# Patient Record
Sex: Female | Born: 1995 | Race: White | Hispanic: No | Marital: Married
Health system: Southern US, Community
[De-identification: ages and names within clinical notes are randomized; demographics above are authoritative.]

## PROBLEM LIST (undated history)

## (undated) DIAGNOSIS — F32A Depression, unspecified: Secondary | ICD-10-CM

## (undated) DIAGNOSIS — F419 Anxiety disorder, unspecified: Secondary | ICD-10-CM

## (undated) HISTORY — PX: OTHER SURGICAL HISTORY: SHX169

## (undated) HISTORY — PX: BREAST SURGERY: SHX581

---

## 2019-10-01 ENCOUNTER — Other Ambulatory Visit: Payer: Self-pay

## 2019-10-01 DIAGNOSIS — Z20822 Contact with and (suspected) exposure to covid-19: Secondary | ICD-10-CM

## 2019-10-03 LAB — NOVEL CORONAVIRUS, NAA: SARS-CoV-2, NAA: NOT DETECTED

## 2020-07-15 ENCOUNTER — Ambulatory Visit
Admission: EM | Admit: 2020-07-15 | Discharge: 2020-07-15 | Disposition: A | Discharge status: Home / Self Care | Payer: BC Managed Care – PPO

## 2020-07-15 ENCOUNTER — Other Ambulatory Visit: Payer: Self-pay

## 2020-07-15 DIAGNOSIS — Z111 Encounter for screening for respiratory tuberculosis: Secondary | ICD-10-CM | POA: Diagnosis not present

## 2020-07-18 ENCOUNTER — Other Ambulatory Visit: Payer: Self-pay

## 2020-07-18 ENCOUNTER — Ambulatory Visit
Admission: EM | Admit: 2020-07-18 | Discharge: 2020-07-18 | Disposition: A | Discharge status: Home / Self Care | Payer: BC Managed Care – PPO | Attending: Emergency Medicine | Admitting: Emergency Medicine

## 2020-07-18 DIAGNOSIS — Z111 Encounter for screening for respiratory tuberculosis: Secondary | ICD-10-CM

## 2020-07-18 NOTE — ED Triage Notes (Signed)
PPD test result on right forearm was 52mm Negative at 0909 on 07/18/20.

## 2021-11-10 ENCOUNTER — Ambulatory Visit: Payer: BC Managed Care – PPO | Admitting: Urology

## 2021-11-10 NOTE — Progress Notes (Signed)
11/11/21 12:25 PM   [name] [birth year] [id number]  Referring provider:  Haroldine Laws, CNM [address] [place],  [place] [address] Chief Complaint  Patient presents with   Dysuria     HPI: Haley Barnett is a 25 y.o.female who presents today for further evaluation of dysuria.   She reports that she had a UTI in October and was seen at urgent care in [facility]. She brought her labs from this visit.  There are standard blood work and a urine culture growing 10-25 colonies of Enterococcus.  There is no associated urinalysis.  She reports that she was prescribed Macrobid first by urgent care, followed by Cipro when her symptoms quickly recurred and then again by her CNM. She has been experiencing burning during urination that is in the opening of the urethra. She feels that her urinary tract is "bloated/inflamed". She reports that her last UTI was when she was in high school.   She was seen by her PCP, Haroldine Laws, CNM, for urinary urgency, vaginal itching, odor, and dysuria on 10/22/2021. She was given Macrobid on 09/24/2021 and cipro on 10/01/2021 for five day course. Urinalysis was negative for infection. STI screening was negative. Pelvis exam was also unremarkable. She was referred to urology.   She has been having issues with constipation and she saw gastroenterology for this.   She is married and sexually active.  She recently stopped oral contraceptive pills in September for more "natural" approach.  She did started her menstrual cycle.  She has a personal history of anxiety and depression.   Her urinalysis today was negative for infection.    PMH: Anxiety depression  Surgical History: NA  Home Medications:  Allergies as of 11/11/2021       Reactions   Amoxicillin Hives, Rash        Medication List        Accurate as of November 11, 2021 12:25 PM. If you have any questions, ask your nurse or doctor.          Cholecalciferol 25 MCG (1000  UT) tablet Take by mouth.   Uribel 118 MG Caps Take 1 capsule (118 mg total) by mouth 4 (four) times daily as needed. Started by: Vanna Scotland, MD   vitamin B-12 1000 MCG tablet Commonly known as: CYANOCOBALAMIN Take 1,000 mcg by mouth daily.        Allergies:  Allergies  Allergen Reactions   Amoxicillin Hives and Rash    Family History: No family history on file.  Social History:  reports that she has never smoked. She has never used smokeless tobacco. She reports that she does not currently use alcohol. No history on file for drug use.   Physical Exam: BP 130/84   Pulse 88   Ht 5\' 10"  (1.778 m)   Wt 145 lb (65.8 kg)   BMI 20.81 kg/m   Constitutional:  Alert and oriented, No acute distress. HEENT: [place] AT, moist mucus membranes.  Trachea midline, no masses. Cardiovascular: No clubbing, cyanosis, or edema. Respiratory: Normal respiratory effort, no increased work of breathing. Skin: No rashes, bruises or suspicious lesions. Neurologic: Grossly intact, no focal deficits, moving all 4 extremities. Psychiatric: Normal mood and affect.  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 -Urinalysis today is unremarkable -Unclear whether or not this is residual inflammation/irritation from UTI  diagnosed back in October which grew low colony count of Enterococcus versus some underlying inflammatory bladder condition such as IC.  Based on her age and lack of comorbidities, do not suspect any additional underlying pathology. -Patient does have a personal history of anxiety, constipation, and recently stopped birth control all of which may be triggering her urinary tract symptoms - Counseled her in UTI prevention supplements such as cranberry tablets, probiotics, yogurt that has active  lactobacillus culture, and d-mannose  (hold cranberry if worsens symptoms) -We discussed the diease pathophysiology of IC which is poorly understood therefore treatment has been focused primarily on symptomatic relief as well as dietary and behavioral modification. Information pamphlets were reviewed and given today discussing the current understanding of the syndrome as well as treatment options. IC dietary information also given today as many patients experience relief with simple lifestyle modifications. We also discussed intermittent use of Uribel as needed  If conservative management fails, will consider further work up with cystoscopy to access for Hunter's ulcers or other more aggressive treatments, however, response to each of these interventions is highly variable.   - Prescription for Uribel to be used prn  - Urine sent for Mycoplasma/ureaplasms culture  -Follow-up if symptoms fail to improve or do not resolve   Tawni Millers as a scribe for Vanna Scotland, MD.,have documented all relevant documentation on the behalf of Vanna Scotland, MD,as directed by  Vanna Scotland, MD while in the presence of Vanna Scotland, MD.   [facility] [address], [address] [place], [place] [address] [phone]  I spent 45 total minutes on the day of the encounter including pre-visit review of the medical record, face-to-face time with the patient, and post visit ordering of labs/imaging/tests.

## 2021-11-11 ENCOUNTER — Encounter: Payer: Self-pay | Admitting: Urology

## 2021-11-11 ENCOUNTER — Ambulatory Visit: Payer: BC Managed Care – PPO | Admitting: Urology

## 2021-11-11 ENCOUNTER — Other Ambulatory Visit: Payer: Self-pay

## 2021-11-11 VITALS — BP 130/84 | HR 88 | Ht 70.0 in | Wt 145.0 lb

## 2021-11-11 DIAGNOSIS — R300 Dysuria: Secondary | ICD-10-CM | POA: Diagnosis not present

## 2021-11-11 LAB — URINALYSIS, COMPLETE
Bilirubin, UA: NEGATIVE
Glucose, UA: NEGATIVE
Ketones, UA: NEGATIVE
Leukocytes,UA: NEGATIVE
Nitrite, UA: NEGATIVE
Protein,UA: NEGATIVE
Specific Gravity, UA: <1.005 — ABNORMAL LOW (ref 1.005–1.030)
Urobilinogen, Ur: 0.2 mg/dL (ref 0.2–1.0)
pH, UA: 7 (ref 5.0–7.5)

## 2021-11-11 LAB — MICROSCOPIC EXAMINATION
Bacteria, UA: NONE SEEN
RBC, Urine: NONE SEEN /hpf (ref 0–2)
WBC, UA: NONE SEEN /hpf (ref 0–5)

## 2021-11-11 MED ORDER — URIBEL 118 MG PO CAPS: 1.0000 | ORAL_CAPSULE | Freq: Four times a day (QID) | ORAL | 0 refills | Status: AC | PRN

## 2021-11-11 NOTE — Patient Instructions (Addendum)
Cranberry Tablets as directed. Probiotics as directed. D-Mannose as directed.     IC/BPS Flare Fact Sheet  Interstitial cystitis patients often struggle with "flares," a sudden and dramatic worsening of their bladder symptoms. Lasting from hours to weeks, flares can be unpredictable, disruptive and difficult to manage for newly diagnosed and veteran IC patients. Bladder wall irritation is the most common type of flare, often occurring after patients eat acidic foods, are under high stress or struggling with hormone changes. Pelvic floor muscle tension can also drive flares, particularly when those muscles are provoked through riding in a car or sexual intimacy. The good news is that flares are generally short term. They do, however, require prompt intervention to reduce/avoid the trigger and minimize the duration of the flare.  Typical Flare Triggers  DIET - 95% of patients report that certain foods exacerbate their IC symptoms. The most common offenders are foods high in acid, caffeine and alcohol.  DRIVING - 14% of IC patients report pain and discomfort while riding in a car, train, airplane or on a motorcycle. Patients are encouraged to avoid long rides until their condition has improved.  STRESS & ANXIETY - High periods of physical or emotional stress can exacerbate symptoms, including periods of intense cold, heat and emotional distress.  SEX & INTIMACY - Men with IC may experience pain at the moment of orgasm while women often feel their worst 24-48 hours after intercourse, the result of pelvic floor muscle spasms.  EXERCISE - Exercise that jars or puts pressure on the pelvic floor (i.e. riding a bicycle or motorcycle, spinning, running or stairs) can exacerbate pelvic pain and bladder symptoms. Exercises that keep the hips level are ideal, such as: walking, elliptical, rowing, gentle yoga and/or pilates.  HORMONES - Many women struggle with short term flares during  ovulation and a few days before their period. Ironically, some patients flare when their progesterone levels are higher, while others flare when their estrogen levels are higher. Post-menopausal women may experience more bladder, urethral and vulvar sensitivity due to dryness of the skin. Using a compounded, preservative-free estrogen cream may help patients with estrogen atrophy.  CHEMICAL EXPOSURE - Chemical exposures can trigger an IC flare, including: scented candles, room fresheners, cleansers, paints, solvents and pest control products. Patients with sensitive skin report that scented laundry detergent, fabric softeners and/or dryer sheets can trigger urethral, vulvar or rectal discomfort.  The [facility] offers more detailed information on flares and hour by hour rescue plans that can help reduce discomfort.

## 2021-11-17 ENCOUNTER — Encounter: Payer: Self-pay | Admitting: Urology

## 2021-11-17 LAB — MYCOPLASMA / UREAPLASMA CULTURE
Mycoplasma hominis Culture: NEGATIVE
Ureaplasma urealyticum: NEGATIVE

## 2022-02-10 ENCOUNTER — Other Ambulatory Visit: Payer: Self-pay | Admitting: Gastroenterology

## 2022-02-10 DIAGNOSIS — R1900 Intra-abdominal and pelvic swelling, mass and lump, unspecified site: Secondary | ICD-10-CM

## 2022-02-28 ENCOUNTER — Ambulatory Visit
Admission: RE | Admit: 2022-02-28 | Discharge: 2022-02-28 | Disposition: A | Discharge status: Home / Self Care | Payer: BC Managed Care – PPO | Source: Ambulatory Visit | Attending: Gastroenterology | Admitting: Gastroenterology

## 2022-02-28 ENCOUNTER — Other Ambulatory Visit: Payer: Self-pay

## 2022-02-28 DIAGNOSIS — R1900 Intra-abdominal and pelvic swelling, mass and lump, unspecified site: Secondary | ICD-10-CM | POA: Insufficient documentation

## 2022-03-01 ENCOUNTER — Encounter: Payer: Self-pay | Admitting: General Surgery

## 2022-03-02 ENCOUNTER — Ambulatory Visit
Admission: RE | Admit: 2022-03-02 | Discharge: 2022-03-02 | Disposition: A | Discharge status: Home / Self Care | Payer: BC Managed Care – PPO | Attending: General Surgery | Admitting: General Surgery

## 2022-03-02 ENCOUNTER — Ambulatory Visit: Payer: BC Managed Care – PPO | Admitting: Registered Nurse

## 2022-03-02 ENCOUNTER — Encounter: Payer: Self-pay | Admitting: General Surgery

## 2022-03-02 ENCOUNTER — Encounter
Admission: RE | Disposition: A | Discharge status: Home / Self Care | Payer: Self-pay | Source: Home / Self Care | Attending: General Surgery

## 2022-03-02 DIAGNOSIS — K5900 Constipation, unspecified: Secondary | ICD-10-CM | POA: Diagnosis not present

## 2022-03-02 HISTORY — DX: Anxiety disorder, unspecified: F41.9

## 2022-03-02 HISTORY — DX: Depression, unspecified: F32.A

## 2022-03-02 HISTORY — PX: COLONOSCOPY WITH PROPOFOL: SHX5780

## 2022-03-02 LAB — POCT PREGNANCY, URINE
Preg Test, Ur: NEGATIVE
Preg Test, Ur: NEGATIVE

## 2022-03-02 SURGERY — COLONOSCOPY WITH PROPOFOL
Anesthesia: General

## 2022-03-02 MED ORDER — SODIUM CHLORIDE 0.9 % IV SOLN: INTRAVENOUS | Status: DC

## 2022-03-02 MED ORDER — EPHEDRINE SULFATE (PRESSORS) 50 MG/ML IJ SOLN
INTRAMUSCULAR | Status: DC | PRN
  Administered 2022-03-02: 5 mg via INTRAVENOUS

## 2022-03-02 MED ORDER — PROPOFOL 500 MG/50ML IV EMUL
INTRAVENOUS | Status: DC | PRN
  Administered 2022-03-02: 200 ug/kg/min via INTRAVENOUS

## 2022-03-02 MED ORDER — PROPOFOL 10 MG/ML IV BOLUS
INTRAVENOUS | Status: DC | PRN
Start: 2022-03-02 — End: 2022-03-02
  Administered 2022-03-02: 10 mg via INTRAVENOUS
  Administered 2022-03-02: 70 mg via INTRAVENOUS
  Administered 2022-03-02 (×2): 10 mg via INTRAVENOUS
  Administered 2022-03-02: 50 mg via INTRAVENOUS
  Administered 2022-03-02: 10 mg via INTRAVENOUS
  Administered 2022-03-02 (×2): 30 mg via INTRAVENOUS
  Administered 2022-03-02: 10 mg via INTRAVENOUS

## 2022-03-02 MED ORDER — DEXMEDETOMIDINE HCL 200 MCG/2ML IV SOLN
INTRAVENOUS | Status: DC | PRN
  Administered 2022-03-02: 20 ug via INTRAVENOUS

## 2022-03-02 MED ORDER — LIDOCAINE HCL (CARDIAC) PF 100 MG/5ML IV SOSY
PREFILLED_SYRINGE | INTRAVENOUS | Status: DC | PRN
  Administered 2022-03-02: 100 mg via INTRAVENOUS

## 2022-03-02 MED ORDER — PHENYLEPHRINE HCL (PRESSORS) 10 MG/ML IV SOLN
INTRAVENOUS | Status: DC | PRN
  Administered 2022-03-02: 120 ug via INTRAVENOUS
  Administered 2022-03-02: 80 ug via INTRAVENOUS

## 2022-03-02 NOTE — Transfer of Care (Signed)
Immediate Anesthesia Transfer of Care Note ? ?Patient: Haley Barnett ? ?Procedure(s) Performed: COLONOSCOPY WITH PROPOFOL ? ?Patient Location: Endoscopy Unit ? ?Anesthesia Type:General ? ?Level of Consciousness: drowsy ? ?Airway & Oxygen Therapy: Patient Spontanous Breathing ? ?Post-op Assessment: Report given to RN and Post -op Vital signs reviewed and stable ? ?Post vital signs: Reviewed and stable ? ?Last Vitals:  ?Vitals Value Taken Time  ?BP 92/39 03/02/22 0820  ?Temp    ?Pulse 67 03/02/22 0820  ?Resp 19 03/02/22 0820  ?SpO2 99 % 03/02/22 0820  ?Vitals shown include unvalidated device data. ? ?Last Pain:  ?Vitals:  ? 03/02/22 0731  ?TempSrc: Tympanic  ?PainSc: 0-No pain  ?   ? ?  ? ?Complications: No notable events documented. ?

## 2022-03-02 NOTE — Op Note (Addendum)
[facility] ?Gastroenterology ?Patient Name: Haley Haley ?Procedure Date: 03/02/2022 7:49 AM ?MRN: [id number] ?Account #: [id number] ?Date of Birth: 05/27/1996 ?Admit Type: Outpatient ?Age: 26 ?Room: [facility] ENDO ROOM 1 ?Gender: Female ?Note Status: Supervisor Override ?Instrument Name: Peds Colonoscope [id number] ?Procedure:             Colonoscopy ?Indications:           Abnormal rectal exam, Change in bowel habits ?Providers:             Earline Mayotte, MD ?Referring MD:          Haley Applebaum, MD (Referring MD) ?Medicines:             Propofol per Anesthesia ?Complications:         No immediate complications. ?Procedure:             Pre-Anesthesia Assessment: ?                       - Prior to the procedure, a History and Physical was  ?                       performed, and patient medications, allergies and  ?                       sensitivities were reviewed. The patient's tolerance  ?                       of previous anesthesia was reviewed. ?                       - The risks and benefits of the procedure and the  ?                       sedation options and risks were discussed with the  ?                       patient. All questions were answered and informed  ?                       consent was obtained. ?                       After obtaining informed consent, the colonoscope was  ?                       passed under direct vision. Throughout the procedure,  ?                       the patient's blood pressure, pulse, and oxygen  ?                       saturations were monitored continuously. The  ?                       Colonoscope was introduced through the anus and  ?                       advanced to the the cecum, identified by appendiceal  ?  orifice and ileocecal valve. The patient tolerated the  ?                       procedure well. The quality of the bowel preparation  ?                       was excellent. The colonoscopy was somewhat difficult  ?                        due to significant looping. Successful completion of  ?                       the procedure was aided by using manual pressure. ?Findings: ?     The entire examined colon appeared normal on direct and retroflexion  ?     views. ?     The digital rectal exam showed a retro-flexed uterus without any  ?     adjacent mucosal abnormality./ ?Impression:            - The entire examined colon is normal on direct and  ?                       retroflexion views. ?                       - No specimens collected. ?Recommendation:        - Discharge patient to home (via wheelchair). ?                       - Resume regular diet. ?Procedure Code(s):     --- Professional --- ?                       [phone], Colonoscopy, flexible; diagnostic, including  ?                       collection of specimen(s) by brushing or washing, when  ?                       performed (separate procedure) ?CPT copyright 2019 American Medical Association. All rights reserved. ?The codes documented in this report are preliminary and upon coder review may  ?be revised to meet current compliance requirements. ?Earline Mayotte, MD ?03/02/2022 8:19:49 AM ?This report has been signed electronically. ?Number of Addenda: 0 ?Note Initiated On: 03/02/2022 7:49 AM ?Scope Withdrawal Time: 0 hours 6 minutes 48 seconds  ?Total Procedure Duration: 0 hours 15 minutes 5 seconds  ?Estimated Blood Loss:  Estimated blood loss: none. ?     [facility] ?

## 2022-03-02 NOTE — Anesthesia Preprocedure Evaluation (Signed)
Anesthesia Evaluation  ?Patient identified by MRN, date of birth, ID band ?Patient awake ? ? ? ?Reviewed: ?Allergy & Precautions, NPO status , Patient's Chart, lab work & pertinent test results ? ?History of Anesthesia Complications ?Negative for: history of anesthetic complications ? ?Airway ?Mallampati: III ? ?TM Distance: >3 FB ?Neck ROM: full ? ? ? Dental ? ?(+) Chipped ?  ?Pulmonary ?neg pulmonary ROS, neg shortness of breath,  ?  ?Pulmonary exam normal ? ? ? ? ? ? ? Cardiovascular ?Exercise Tolerance: Good ?(-) Past MI negative cardio ROS ?Normal cardiovascular exam ? ? ?  ?Neuro/Psych ?PSYCHIATRIC DISORDERS negative neurological ROS ?   ? GI/Hepatic ?negative GI ROS, Neg liver ROS, neg GERD  ,  ?Endo/Other  ?negative endocrine ROS ? Renal/GU ?negative Renal ROS  ?negative genitourinary ?  ?Musculoskeletal ? ? Abdominal ?  ?Peds ? Hematology ?negative hematology ROS ?(+)   ?Anesthesia Other Findings ?Past Medical History: ?No date: Anxiety ?No date: Depression ? ?Past Surgical History: ?No date: BREAST SURGERY ?No date: wisdom teeth rmoval ? ?BMI   ? Body Mass Index: 20.81 kg/m?  ?  ? ? Reproductive/Obstetrics ?negative OB ROS ? ?  ? ? ? ? ? ? ? ? ? ? ? ? ? ?  ?  ? ? ? ? ? ? ? ? ?Anesthesia Physical ?Anesthesia Plan ? ?ASA: 2 ? ?Anesthesia Plan: General  ? ?Post-op Pain Management:   ? ?Induction: Intravenous ? ?PONV Risk Score and Plan: Propofol infusion and TIVA ? ?Airway Management Planned: Natural Airway and Nasal Cannula ? ?Additional Equipment:  ? ?Intra-op Plan:  ? ?Post-operative Plan:  ? ?Informed Consent: I have reviewed the patients History and Physical, chart, labs and discussed the procedure including the risks, benefits and alternatives for the proposed anesthesia with the patient or authorized representative who has indicated his/her understanding and acceptance.  ? ? ? ?Dental Advisory Given ? ?Plan Discussed with: Anesthesiologist, CRNA and  Surgeon ? ?Anesthesia Plan Comments: (Patient consented for risks of anesthesia including but not limited to:  ?- adverse reactions to medications ?- risk of airway placement if required ?- damage to eyes, teeth, lips or other oral mucosa ?- nerve damage due to positioning  ?- sore throat or hoarseness ?- Damage to heart, brain, nerves, lungs, other parts of body or loss of life ? ?Patient voiced understanding.)  ? ? ? ? ? ? ?Anesthesia Quick Evaluation ? ?

## 2022-03-02 NOTE — Anesthesia Postprocedure Evaluation (Signed)
Anesthesia Post Note ? ?Patient: Haley Barnett ? ?Procedure(s) Performed: COLONOSCOPY WITH PROPOFOL ? ?Patient location during evaluation: Endoscopy ?Anesthesia Type: General ?Level of consciousness: awake and alert ?Pain management: pain level controlled ?Vital Signs Assessment: post-procedure vital signs reviewed and stable ?Respiratory status: spontaneous breathing, nonlabored ventilation, respiratory function stable and patient connected to nasal cannula oxygen ?Cardiovascular status: blood pressure returned to baseline and stable ?Postop Assessment: no apparent nausea or vomiting ?Anesthetic complications: no ? ? ?No notable events documented. ? ? ?Last Vitals:  ?Vitals:  ? 03/02/22 0840 03/02/22 0850  ?BP: 94/60 (!) 103/55  ?Pulse: 84 86  ?Resp: 20 16  ?Temp:    ?SpO2: 100% 100%  ?  ?Last Pain:  ?Vitals:  ? 03/02/22 0731  ?TempSrc: Tympanic  ?PainSc: 0-No pain  ? ? ?  ?  ?  ?  ?  ?  ? ?Cleda Mccreedy Lariza Cothron ? ? ? ? ?

## 2022-03-02 NOTE — H&P (Signed)
Haley Barnett ?[id number] ?[birth year] ? ?  ? 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 Abnormal physical exam with Ms. Kathryne Hitch in GI.  Normal pelvic ultrasound.  Tolerated prep well. ? ?For colonoscopy.  ? ?Medications Prior to Admission  ?Medication Sig Dispense Refill Last Dose  ? ergocalciferol (VITAMIN D2) 1.25 MG (50000 UT) capsule Take 50,000 Units by mouth once a week.     ? norethindrone-ethinyl estradiol-FE (BLISOVI FE 1/20) 1-20 MG-MCG tablet Take 1 tablet by mouth daily.     ? Cholecalciferol 25 MCG (1000 UT) tablet Take by mouth.     ? Meth-Hyo-M Bl-Na Phos-Ph Sal (URIBEL) 118 MG CAPS Take 1 capsule (118 mg total) by mouth 4 (four) times daily as needed. 120 capsule 0   ? vitamin B-12 (CYANOCOBALAMIN) 1000 MCG tablet Take 1,000 mcg by mouth daily.     ? ?Allergies  ?Allergen Reactions  ? Amoxicillin Hives and Rash  ? ?Past Medical History:  ?Diagnosis Date  ? Anxiety   ? Depression   ? ?Past Surgical History:  ?Procedure Laterality Date  ? BREAST SURGERY    ? wisdom teeth rmoval    ? ?Social History  ? ?Socioeconomic History  ? Marital status: Married  ?  Spouse name: Not on file  ? Number of children: Not on file  ? Years of education: Not on file  ? Highest education level: Not on file  ?Occupational History  ? Not on file  ?Tobacco Use  ? Smoking status: Never  ? Smokeless tobacco: Never  ?Vaping Use  ? Vaping Use: Never used  ?Substance and Sexual Activity  ? Alcohol use: Not Currently  ?  Comment: occas  ? Drug use: Never  ? Sexual activity: Not on file  ?Other Topics Concern  ? Not on file  ?Social History Narrative  ? Not on file  ? ?Social Determinants of Health  ? ?Financial Resource Strain: Not on file  ?Food Insecurity: Not on file  ?Transportation Needs: Not on file  ?Physical Activity: Not on file  ?Stress: Not on file  ?Social Connections: Not on file  ?Intimate Partner Violence: Not on file  ? ?Social History   ? ?Social History Narrative  ? Not on file  ? ? ? ?ROS: Negative.  ? ? ? ?PE: ?HEENT: Negative. ?Lungs: Clear. ?Cardio: RR. ? ? ?Assessment/Plan: ? ?Proceed with planned endoscopy.  ? ?Earline Mayotte ?03/02/2022 ? ?  ?

## 2022-07-26 ENCOUNTER — Ambulatory Visit: Payer: BC Managed Care – PPO | Admitting: Physician Assistant

## 2022-07-26 ENCOUNTER — Encounter: Payer: Self-pay | Admitting: Physician Assistant

## 2022-07-26 VITALS — BP 123/82 | HR 79 | Ht 70.0 in | Wt 135.0 lb

## 2022-07-26 DIAGNOSIS — R300 Dysuria: Secondary | ICD-10-CM

## 2022-07-26 NOTE — Progress Notes (Unsigned)
07/26/2022 4:40 PM   Haley Barnett 1996/08/17 [id number]  CC: Chief Complaint  Patient presents with   Dysuria    HPI: Haley Barnett is a 26 y.o. female with PMH constipation and recurrent UTIs versus IC who presents today for evaluation of possible UTI.   Today she reports 5 days of frequency, urgency, back pain, lower abdominal pain, nausea, and genital discharge earlier this month coinciding with the start date of her anticipated period.  Her period was 5 days late but she took several urine pregnancy test, all of which were negative.  She had a normal 5-day menstrual cycle starting 6 days ago with her most recent negative UPT 3 days ago.  Overall her symptoms have improved on their own, but she is having some persistent dysuria.  She reports her bladder burning is worse with sex.  She has been seeing a Museum/gallery exhibitions officer and started on IC diet, which has been helping with her bladder pain.  She was previously on d-mannose but is stopped this.  She just started a new supplement called Bladder Ease.  In-office UA today pan negative; urine microscopy with moderate bacteria.  PMH: Past Medical History:  Diagnosis Date   Anxiety    Depression     Surgical History: Past Surgical History:  Procedure Laterality Date   BREAST SURGERY     COLONOSCOPY WITH PROPOFOL N/A 03/02/2022   Procedure: COLONOSCOPY WITH PROPOFOL;  Surgeon: Earline Mayotte, MD;  Location: [facility] ENDOSCOPY;  Service: Endoscopy;  Laterality: N/A;   wisdom teeth rmoval      Home Medications:  Allergies as of 07/26/2022       Reactions   Amoxicillin Hives, Rash        Medication List        Accurate as of July 26, 2022  4:40 PM. If you have any questions, ask your nurse or doctor.          Blisovi FE 1/20 1-20 MG-MCG tablet Generic drug: norethindrone-ethinyl estradiol-FE Take 1 tablet by mouth daily.   Cholecalciferol 25 MCG (1000 UT) tablet Take by mouth.   cyanocobalamin 1000 MCG  tablet Commonly known as: VITAMIN B12 Take 1,000 mcg by mouth daily.   ergocalciferol 1.25 MG (50000 UT) capsule Commonly known as: VITAMIN D2 Take 50,000 Units by mouth once a week.   Uribel 118 MG Caps Take 1 capsule (118 mg total) by mouth 4 (four) times daily as needed.        Allergies:  Allergies  Allergen Reactions   Amoxicillin Hives and Rash    Family History: No family history on file.  Social History:   reports that she has never smoked. She has never used smokeless tobacco. She reports that she does not currently use alcohol. She reports that she does not use drugs.  Physical Exam: BP 123/82   Pulse 79   Ht 5\' 10"  (1.778 m)   Wt 135 lb (61.2 kg)   BMI 19.37 kg/m   Constitutional:  Alert and oriented, no acute distress, nontoxic appearing HEENT: [place], AT Cardiovascular: No clubbing, cyanosis, or edema Respiratory: Normal respiratory effort, no increased work of breathing Skin: No rashes, bruises or suspicious lesions Neurologic: Grossly intact, no focal deficits, moving all 4 extremities Psychiatric: Normal mood and affect  Laboratory Data: Results for orders placed or performed in visit on 07/26/22  Microscopic Examination   Urine  Result Value Ref Range   WBC, UA 0-5 0 - 5 /hpf   RBC, Urine 0-2  0 - 2 /hpf   Epithelial Cells (non renal) 0-10 0 - 10 /hpf   Bacteria, UA Moderate (A) None seen/Few  Urinalysis, Complete  Result Value Ref Range   Specific Gravity, UA 1.010 1.005 - 1.030   pH, UA 6.5 5.0 - 7.5   Color, UA Yellow Yellow   Appearance Ur Clear Clear   Leukocytes,UA Negative Negative   Protein,UA Negative Negative/Trace   Glucose, UA Negative Negative   Ketones, UA Negative Negative   RBC, UA Negative Negative   Bilirubin, UA Negative Negative   Urobilinogen, Ur 0.2 0.2 - 1.0 mg/dL   Nitrite, UA Negative Negative   Microscopic Examination See below:    Assessment & Plan:   1. Dysuria Irritative voiding symptoms at the time of  anticipated menstrual period, symptoms have improved and she has had multiple negative UPT's.  I offered her a serum hCG test today, but she declined.  UA is reassuring for infection, no further urologic intervention indicated.  We discussed that if she continues to have abdominal pain, nausea, and irritative voiding symptoms in a cyclic manner based on her menstrual cycle, I would like her to follow-up with her GYN for consideration of possible endometriosis.  She expressed understanding. - Urinalysis, Complete  Return if symptoms worsen or fail to improve.  Carman Ching, PA-C  [facility] [address] [place], [place] [address] [phone]

## 2022-07-27 LAB — URINALYSIS, COMPLETE
Bilirubin, UA: NEGATIVE
Glucose, UA: NEGATIVE
Ketones, UA: NEGATIVE
Leukocytes,UA: NEGATIVE
Nitrite, UA: NEGATIVE
Protein,UA: NEGATIVE
RBC, UA: NEGATIVE
Specific Gravity, UA: 1.01 (ref 1.005–1.030)
Urobilinogen, Ur: 0.2 mg/dL (ref 0.2–1.0)
pH, UA: 6.5 (ref 5.0–7.5)

## 2022-07-27 LAB — MICROSCOPIC EXAMINATION

## 2023-02-10 ENCOUNTER — Emergency Department
Admission: EM | Admit: 2023-02-10 | Discharge: 2023-02-10 | Disposition: A | Discharge status: Home / Self Care | Payer: BC Managed Care – PPO | Attending: Student in an Organized Health Care Education/Training Program | Admitting: Student in an Organized Health Care Education/Training Program

## 2023-02-10 ENCOUNTER — Encounter: Payer: Self-pay | Admitting: Intensive Care

## 2023-02-10 ENCOUNTER — Other Ambulatory Visit: Payer: Self-pay

## 2023-02-10 DIAGNOSIS — S80812A Abrasion, left lower leg, initial encounter: Secondary | ICD-10-CM

## 2023-02-10 DIAGNOSIS — S0990XA Unspecified injury of head, initial encounter: Secondary | ICD-10-CM | POA: Insufficient documentation

## 2023-02-10 DIAGNOSIS — M7918 Myalgia, other site: Secondary | ICD-10-CM

## 2023-02-10 DIAGNOSIS — Y92410 Unspecified street and highway as the place of occurrence of the external cause: Secondary | ICD-10-CM | POA: Diagnosis not present

## 2023-02-10 DIAGNOSIS — S8012XA Contusion of left lower leg, initial encounter: Secondary | ICD-10-CM | POA: Insufficient documentation

## 2023-02-10 DIAGNOSIS — S8992XA Unspecified injury of left lower leg, initial encounter: Secondary | ICD-10-CM | POA: Diagnosis present

## 2023-02-10 MED ORDER — CYCLOBENZAPRINE HCL 5 MG PO TABS: 5.0000 mg | ORAL_TABLET | Freq: Three times a day (TID) | ORAL | 0 refills | Status: AC | PRN

## 2023-02-10 MED ORDER — MELOXICAM 15 MG PO TABS: 15.0000 mg | ORAL_TABLET | Freq: Every day | ORAL | 0 refills | Status: AC
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
## 2023-02-10 NOTE — Discharge Instructions (Addendum)
Please take Tylenol every 6 hours as needed for any pain or discomfort.  You may use meloxicam daily for pain and inflammation, please take this medication with food and avoid all other NSAIDs such as ibuprofen or Aleve while on the meloxicam.  You may use the Flexeril 3 times a day as needed for any muscle soreness, tension or spasms.  Try to walk to help loosen muscles in your neck and lower back.  Avoid any heavy lifting pushing or pulling.  Follow-up with PCP in 1 week for recheck.  Return to the ER for any severe headaches, vision changes, vomiting, chest pain, shortness of breath.
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
## 2023-03-06 ENCOUNTER — Ambulatory Visit: Payer: BC Managed Care – PPO | Attending: Gerontology | Admitting: Physical Therapy

## 2023-03-06 ENCOUNTER — Encounter: Payer: Self-pay | Admitting: Physical Therapy

## 2023-03-06 DIAGNOSIS — G44209 Tension-type headache, unspecified, not intractable: Secondary | ICD-10-CM | POA: Diagnosis present

## 2023-03-06 DIAGNOSIS — M25512 Pain in left shoulder: Secondary | ICD-10-CM | POA: Diagnosis present

## 2023-03-06 DIAGNOSIS — M542 Cervicalgia: Secondary | ICD-10-CM | POA: Diagnosis not present
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
## 2023-03-15 ENCOUNTER — Ambulatory Visit: Payer: BC Managed Care – PPO | Admitting: Physical Therapy

## 2023-03-15 DIAGNOSIS — M25512 Pain in left shoulder: Secondary | ICD-10-CM

## 2023-03-15 DIAGNOSIS — G44209 Tension-type headache, unspecified, not intractable: Secondary | ICD-10-CM

## 2023-03-15 DIAGNOSIS — M542 Cervicalgia: Secondary | ICD-10-CM | POA: Diagnosis not present
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
## 2023-03-16 ENCOUNTER — Encounter: Payer: Self-pay | Admitting: Physical Therapy

## 2023-03-22 ENCOUNTER — Ambulatory Visit: Payer: BC Managed Care – PPO

## 2023-03-22 DIAGNOSIS — M542 Cervicalgia: Secondary | ICD-10-CM

## 2023-03-22 DIAGNOSIS — M25512 Pain in left shoulder: Secondary | ICD-10-CM

## 2023-03-22 DIAGNOSIS — G44209 Tension-type headache, unspecified, not intractable: Secondary | ICD-10-CM
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
## 2023-03-27 ENCOUNTER — Ambulatory Visit: Payer: BC Managed Care – PPO | Admitting: Physical Therapy

## 2023-03-27 DIAGNOSIS — M542 Cervicalgia: Secondary | ICD-10-CM | POA: Diagnosis not present

## 2023-03-27 DIAGNOSIS — M25512 Pain in left shoulder: Secondary | ICD-10-CM

## 2023-03-27 DIAGNOSIS — G44209 Tension-type headache, unspecified, not intractable: Secondary | ICD-10-CM
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
## 2023-04-01 ENCOUNTER — Encounter: Payer: Self-pay | Admitting: Physical Therapy

## 2023-04-05 ENCOUNTER — Ambulatory Visit: Payer: BC Managed Care – PPO | Attending: Gerontology | Admitting: Physical Therapy

## 2023-04-05 DIAGNOSIS — M25512 Pain in left shoulder: Secondary | ICD-10-CM | POA: Diagnosis present

## 2023-04-05 DIAGNOSIS — M542 Cervicalgia: Secondary | ICD-10-CM | POA: Insufficient documentation

## 2023-04-05 DIAGNOSIS — G44209 Tension-type headache, unspecified, not intractable: Secondary | ICD-10-CM | POA: Diagnosis present
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
## 2023-04-12 ENCOUNTER — Ambulatory Visit: Payer: BC Managed Care – PPO | Admitting: Physical Therapy

## 2023-04-12 ENCOUNTER — Encounter: Payer: Self-pay | Admitting: Physical Therapy

## 2023-04-12 DIAGNOSIS — G44209 Tension-type headache, unspecified, not intractable: Secondary | ICD-10-CM

## 2023-04-12 DIAGNOSIS — M542 Cervicalgia: Secondary | ICD-10-CM | POA: Diagnosis not present

## 2023-04-12 DIAGNOSIS — M25512 Pain in left shoulder: Secondary | ICD-10-CM
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
## 2023-04-19 ENCOUNTER — Encounter: Payer: Self-pay | Admitting: Physical Therapy

## 2023-04-19 ENCOUNTER — Ambulatory Visit: Payer: BC Managed Care – PPO | Admitting: Physical Therapy

## 2023-04-19 DIAGNOSIS — M25512 Pain in left shoulder: Secondary | ICD-10-CM

## 2023-04-19 DIAGNOSIS — G44209 Tension-type headache, unspecified, not intractable: Secondary | ICD-10-CM

## 2023-04-19 DIAGNOSIS — M542 Cervicalgia: Secondary | ICD-10-CM | POA: Diagnosis not present

## 2023-04-26 ENCOUNTER — Ambulatory Visit: Payer: BC Managed Care – PPO | Admitting: Physical Therapy

## 2023-04-26 DIAGNOSIS — M542 Cervicalgia: Secondary | ICD-10-CM | POA: Diagnosis not present

## 2023-04-26 DIAGNOSIS — G44209 Tension-type headache, unspecified, not intractable: Secondary | ICD-10-CM

## 2023-04-26 DIAGNOSIS — M25512 Pain in left shoulder: Secondary | ICD-10-CM
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
## 2023-05-03 ENCOUNTER — Ambulatory Visit: Payer: BC Managed Care – PPO

## 2023-05-03 DIAGNOSIS — G44209 Tension-type headache, unspecified, not intractable: Secondary | ICD-10-CM

## 2023-05-03 DIAGNOSIS — M542 Cervicalgia: Secondary | ICD-10-CM

## 2023-05-03 DIAGNOSIS — M25512 Pain in left shoulder: Secondary | ICD-10-CM
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
## 2023-05-17 ENCOUNTER — Ambulatory Visit: Payer: BC Managed Care – PPO | Attending: Gerontology | Admitting: Physical Therapy

## 2023-05-17 DIAGNOSIS — M25512 Pain in left shoulder: Secondary | ICD-10-CM | POA: Insufficient documentation

## 2023-05-17 DIAGNOSIS — G44209 Tension-type headache, unspecified, not intractable: Secondary | ICD-10-CM | POA: Diagnosis present

## 2023-05-17 DIAGNOSIS — M542 Cervicalgia: Secondary | ICD-10-CM | POA: Insufficient documentation
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
## 2023-05-20 ENCOUNTER — Encounter: Payer: Self-pay | Admitting: Physical Therapy

## 2023-11-22 IMAGING — US US PELVIS COMPLETE WITH TRANSVAGINAL
1 series · 14 of 25 positions shown · non-contrast
Comparison: None

CLINICAL DATA: Pelvic mass felt by gastroenterologist during rectal
exam.

EXAM:
TRANSABDOMINAL AND TRANSVAGINAL ULTRASOUND OF PELVIS
TECHNIQUE: Both transabdominal and transvaginal ultrasound examinations of the
pelvis were performed. Transabdominal technique was performed for
global imaging of the pelvis including uterus, ovaries, adnexal
regions, and pelvic cul-de-sac. It was necessary to proceed with
endovaginal exam following the transabdominal exam to visualize the
endometrium and ovaries.

[Series 1: us pelvis complete with transvaginal · 0.30mm/px · 14 of 128 slices shown]
[im 1/128]
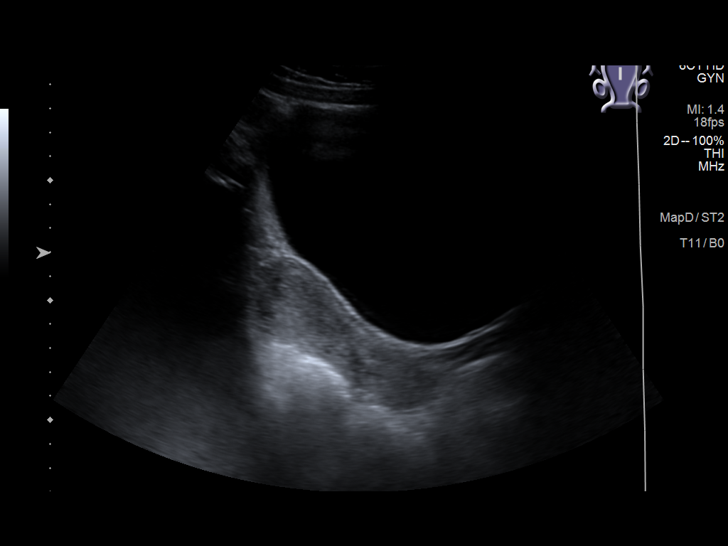
[im 11/128]
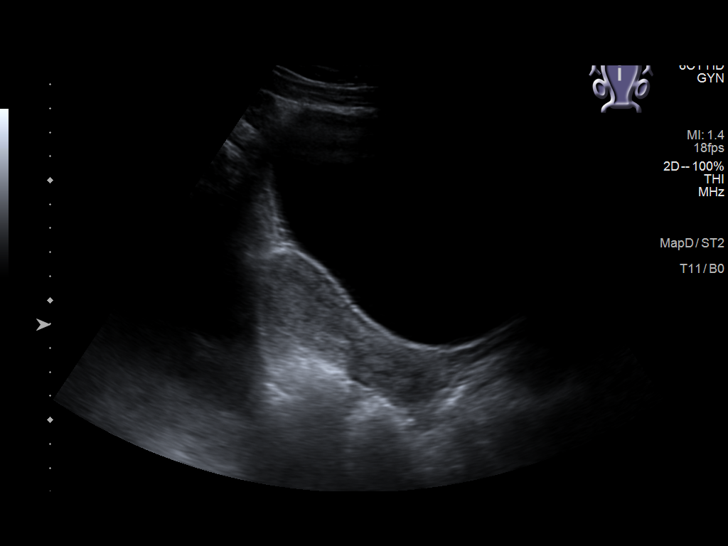
[im 22/128]
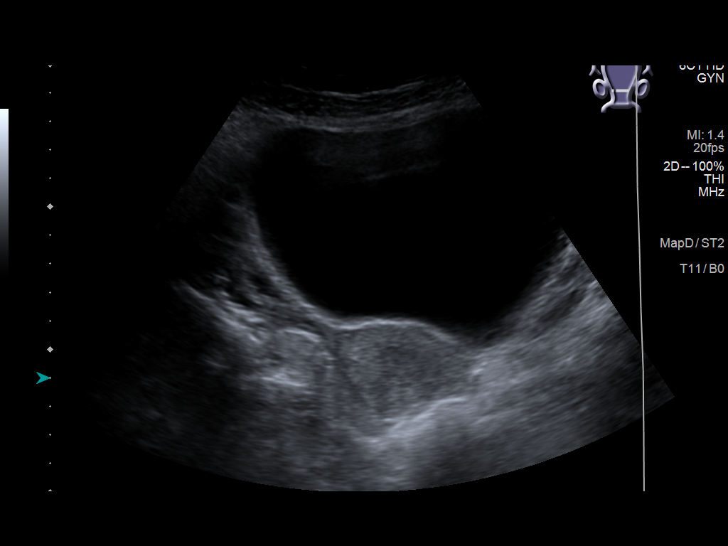
[im 32/128]
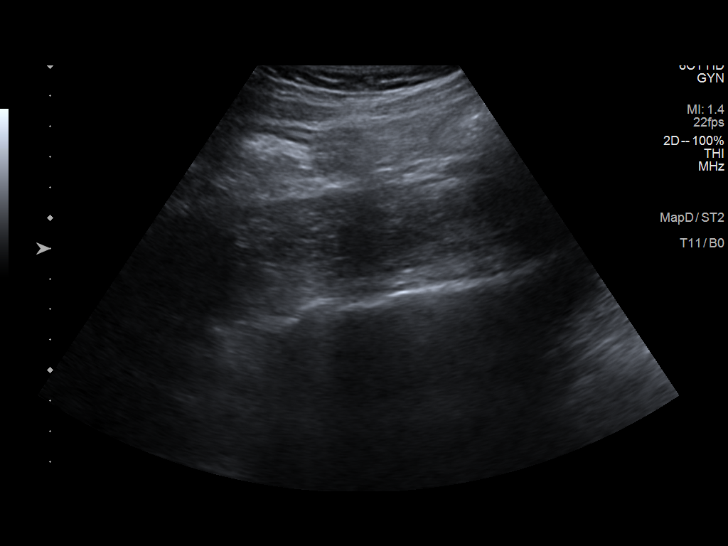
[im 43/128]
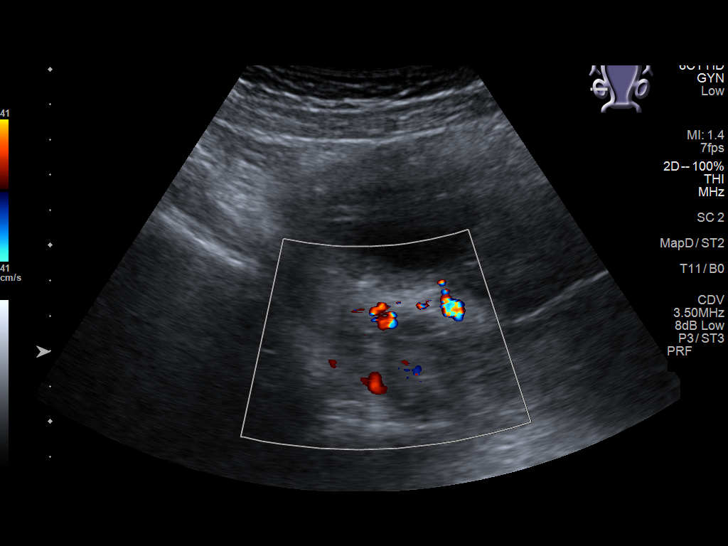
[im 48/128]
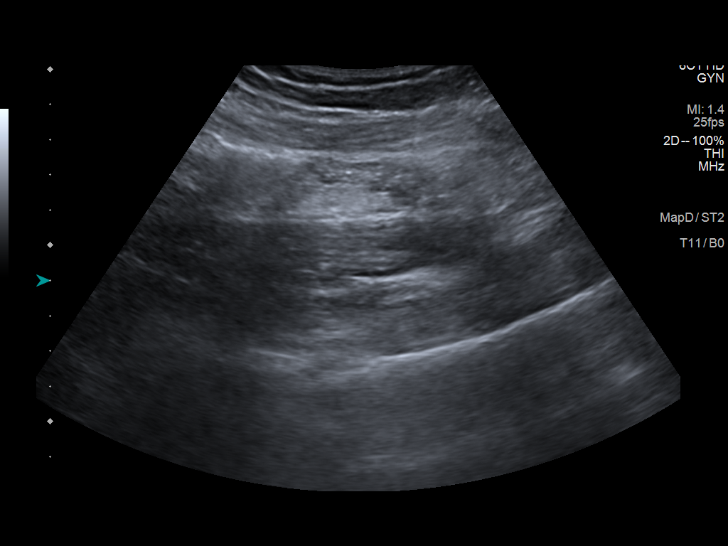
[im 59/128]
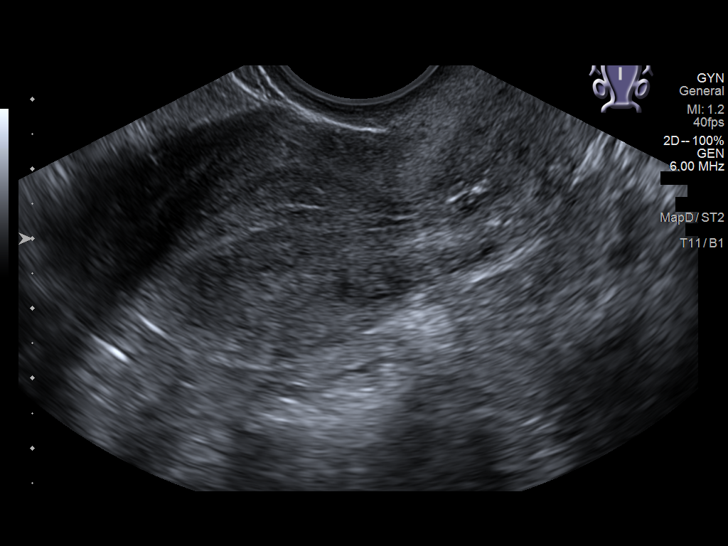
[im 69/128]
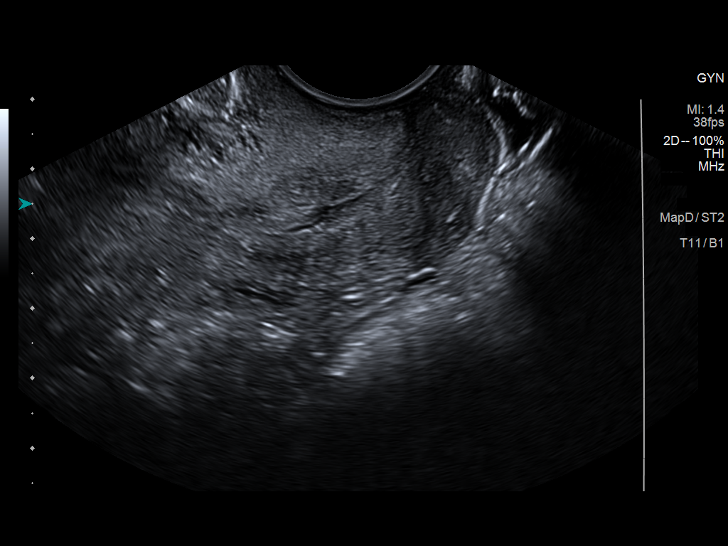
[im 80/128]
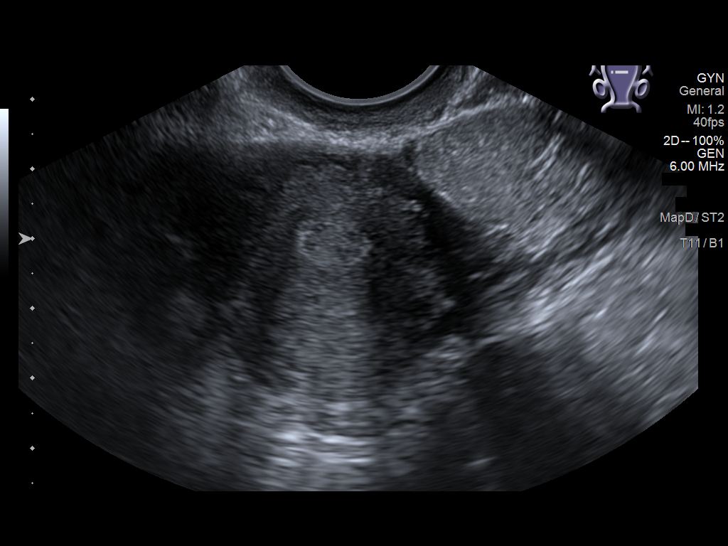
[im 85/128]
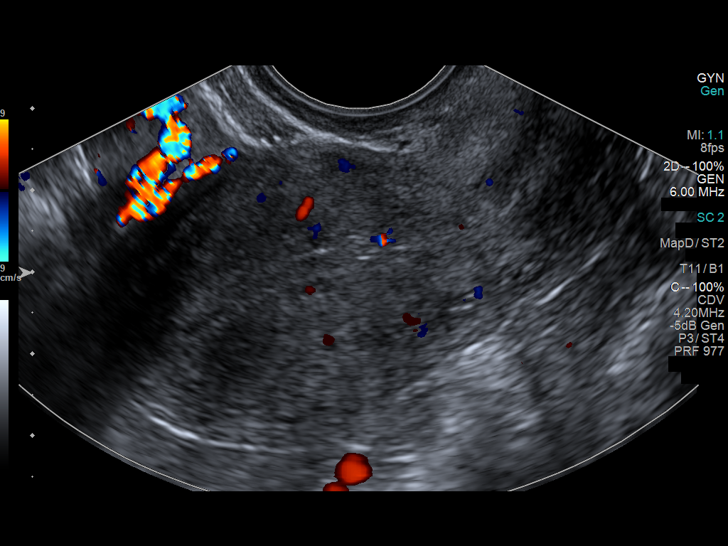
[im 96/128]
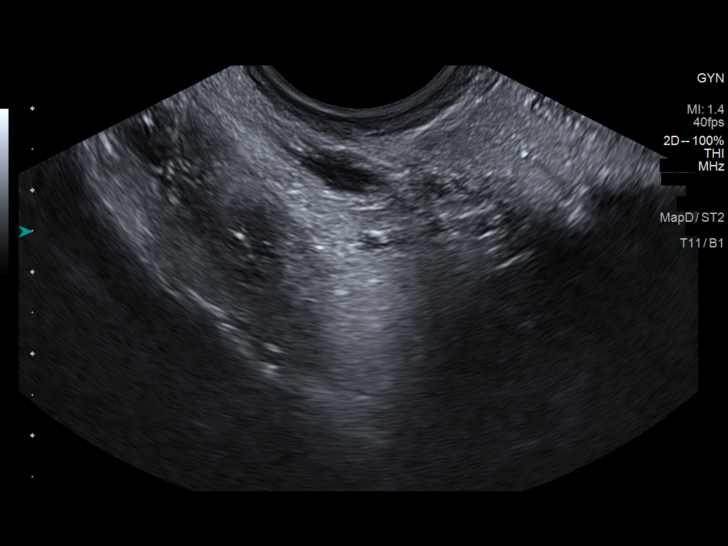
[im 106/128]
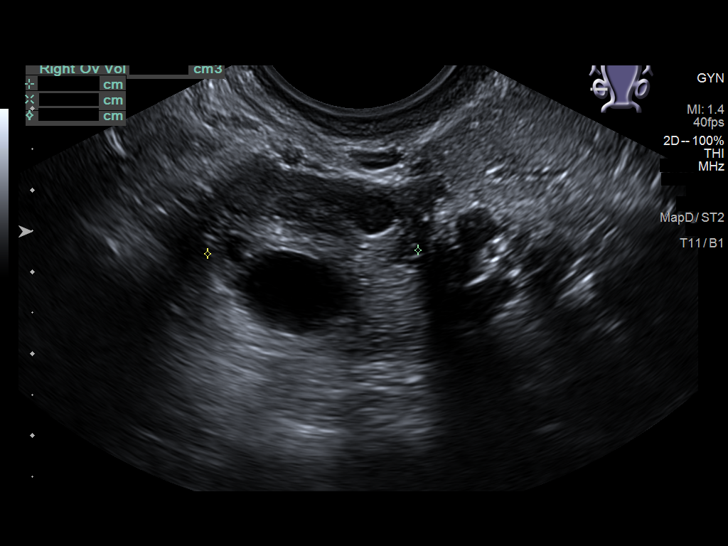
[im 117/128]
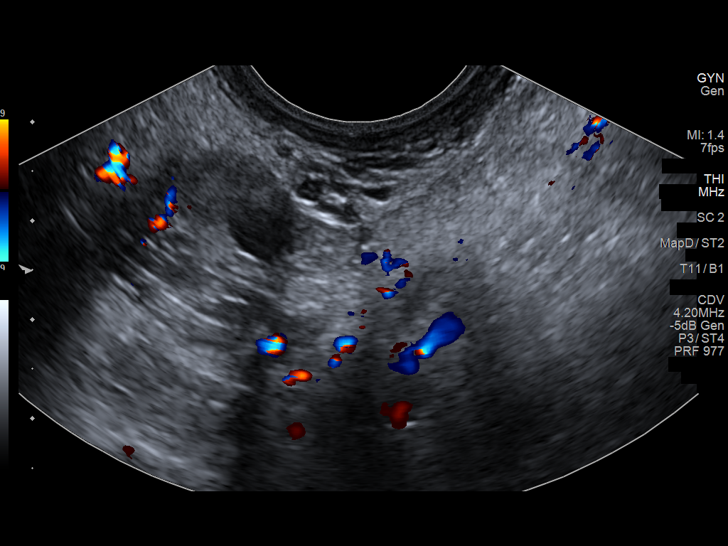
[im 128/128]
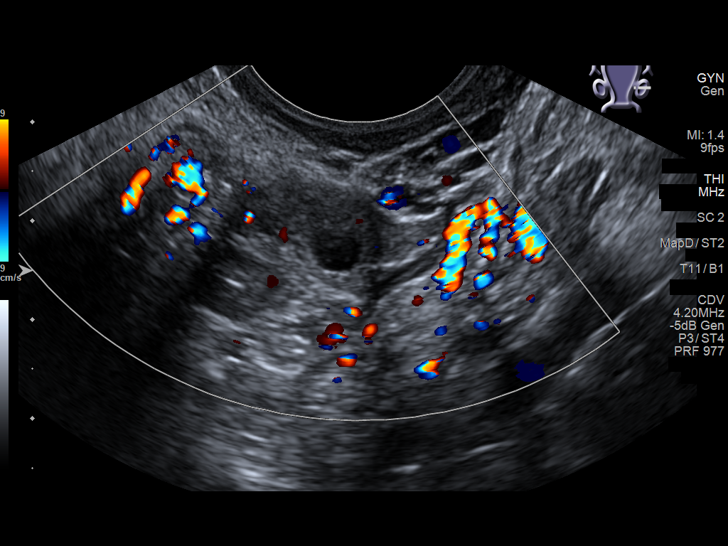

[14 of 25 positions shown; findings below may reference images not displayed]

FINDINGS: Uterus

Measurements: 7.6 x 3.8 x 4.5 cm = volume: 69 mL. No fibroids or
other mass visualized.

Endometrium

Thickness: 8 mm.  No focal abnormality visualized.

Right ovary

Measurements: 3.0 x 2.7 x 2.6 cm = volume: 11.1 mL. Normal
appearance/no adnexal mass.

Left ovary

Measurements: 2.3 x 2.1 x 1.6 cm = volume: 4.0 mL. Normal
appearance/no adnexal mass.

Other findings

No abnormal free fluid.
IMPRESSION: The uterus, endometrium, and ovaries are normal in appearance. No
cause for the mass felt by the gastroenterologist during rectal exam
identified.
